# Patient Record
Sex: Female | Born: 1995 | Hispanic: Yes | Marital: Single | Smoking: Never smoker
Health system: Southern US, Community
[De-identification: ages and names within clinical notes are randomized; demographics above are authoritative.]

---

## 2016-02-19 ENCOUNTER — Encounter (HOSPITAL_COMMUNITY): Payer: Self-pay | Admitting: Emergency Medicine

## 2016-02-19 ENCOUNTER — Emergency Department (HOSPITAL_COMMUNITY)
Admission: EM | Admit: 2016-02-19 | Discharge: 2016-02-19 | Disposition: A | Discharge status: Home / Self Care | Payer: Self-pay | Attending: Emergency Medicine | Admitting: Emergency Medicine

## 2016-02-19 DIAGNOSIS — S50312A Abrasion of left elbow, initial encounter: Secondary | ICD-10-CM | POA: Insufficient documentation

## 2016-02-19 DIAGNOSIS — Y92410 Unspecified street and highway as the place of occurrence of the external cause: Secondary | ICD-10-CM | POA: Insufficient documentation

## 2016-02-19 DIAGNOSIS — M79652 Pain in left thigh: Secondary | ICD-10-CM | POA: Insufficient documentation

## 2016-02-19 DIAGNOSIS — Y9389 Activity, other specified: Secondary | ICD-10-CM | POA: Insufficient documentation

## 2016-02-19 DIAGNOSIS — Y998 Other external cause status: Secondary | ICD-10-CM | POA: Insufficient documentation

## 2016-02-19 NOTE — ED Notes (Addendum)
Pt with swelling noted to LUE, moves extremity well. Pt with pain noted to palpation of  L upper leg.

## 2016-02-19 NOTE — ED Notes (Signed)
Pt L nare started bleeding. Pt given tissue and instructed to pinch top of nose to control bleeding. Bleeding has stopped at this time. MD notified.
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
## 2016-02-23 ENCOUNTER — Ambulatory Visit (INDEPENDENT_AMBULATORY_CARE_PROVIDER_SITE_OTHER): Payer: Self-pay | Admitting: Family Medicine

## 2016-02-23 VITALS — BP 108/68 | HR 77 | Temp 98.8°F | Resp 17 | Ht 61.0 in | Wt 118.0 lb

## 2016-02-23 DIAGNOSIS — S39012A Strain of muscle, fascia and tendon of lower back, initial encounter: Secondary | ICD-10-CM

## 2016-02-23 MED ORDER — CYCLOBENZAPRINE HCL 5 MG PO TABS: ORAL_TABLET | ORAL | Status: DC

## 2016-02-23 NOTE — Patient Instructions (Addendum)
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 intended to replace advice given to you by your health care provider. Make sure you discuss any questions you have with your health care provider.   Document Released: 10/17/2005 Document Revised: 11/07/2014 Document Reviewed: 01/29/2009 [employer] Interactive Patient Education [employer]

## 2016-02-23 NOTE — Progress Notes (Signed)
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 relief. Keep in mind, being in any one position for a prolonged period of time,  no matter how correct your posture, can still lead to stiffness. PROPER SITTING POSTURE In order to minimize stress and discomfort on your spine, you must sit with correct posture. Sitting with good posture should be effortless for a healthy body. Returning to good posture is a gradual process. Many people can work toward this most comfortably by using various supports until they have the flexibility and strength to maintain this posture on their own. When sitting with proper posture, your ears will fall over your shoulders and your shoulders will fall over your hips. You should use the back of the chair to support your upper back. Your lower back will be in a neutral position, just slightly arched. You may place a small pillow or folded towel at the base of your lower back for support.  When working  at a desk, create an environment that supports good, upright posture. Without extra support, muscles tire, which leads to excessive strain on joints and other tissues. Keep these recommendations in mind: CHAIR:  A chair should be able to slide under your desk when your back makes contact with the back of the chair. This allows you to work closely.  The chair's height should allow your eyes to be level with the upper part of your monitor and your hands to be slightly lower than your elbows. BODY POSITION  Your feet should make contact with the floor. If this is not possible, use a foot rest.  Keep your ears over your shoulders. This will reduce stress on your neck and lower back. INCORRECT SITTING POSTURES  If you are feeling tired and unable to assume a healthy sitting posture, do not slouch or slump. This puts excessive strain on your back tissues, causing more damage and pain. Healthier options include:  Using more support, like a lumbar pillow.  Switching tasks to something that requires you to be upright or walking.  Talking a brief walk.  Lying down to rest in a neutral-spine position. PROLONGED STANDING WHILE SLIGHTLY LEANING FORWARD  When completing a task that requires you to lean forward while standing in one place for a long time, place either foot up on a stationary 2-4 inch high object to help maintain the best posture. When both feet are on the ground, the lower back tends to lose its slight inward curve. If this curve flattens (or becomes too large), then the back and your other joints will experience too much stress, tire more quickly, and can cause pain. CORRECT STANDING POSTURES Proper standing posture should be assumed with all daily activities, even if they only take a few moments, like when brushing your teeth. As in sitting, your ears should fall over your shoulders and your shoulders should fall over your hips. You should keep a slight tension in your abdominal muscles  to brace your spine. Your tailbone should point down to the ground, not behind your body, resulting in an over-extended swayback posture.  INCORRECT STANDING POSTURES  Common incorrect standing postures include a forward head, locked knees and/or an excessive swayback. WALKING Walk with an upright posture. Your ears, shoulders and hips should all line-up. PROLONGED ACTIVITY IN A FLEXED POSITION When completing a task that requires you to bend forward at your waist or lean over a low surface, try to find a way to stabilize 3 out of 4 of your limbs. You can place a hand or elbow on your thigh or rest a knee on the surface you are reaching across. This will provide  you more stability so that your muscles do not fatigue as quickly. By keeping your knees relaxed, or slightly bent, you will also reduce stress across your lower back. CORRECT LIFTING TECHNIQUES DO :   Assume a wide stance. This will provide you more stability and the opportunity to get as close as possible to the object which you are lifting.  Tense your abdominals to brace your spine. Bend at the knees and hips. Keeping your back locked in a neutral-spine position, lift using your leg muscles. Lift with your legs, keeping your back straight.  Test the weight of unknown objects before attempting to lift them.  Try to keep your elbows locked down at your sides in order get the best strength from your shoulders when carrying an object.  Always ask for help when lifting heavy or awkward objects. INCORRECT LIFTING TECHNIQUES DO NOT:   Lock your knees when lifting, even if it is a small object.  Bend and twist. Pivot at your feet or move your feet when needing to change directions.  Assume that you can safely pick up even a paper clip without proper posture.   This information is not intended to replace advice given to you by your health care provider. Make sure you discuss any questions you have with your health care provider.     Document Released: 10/17/2005 Document Revised: 11/07/2014 Document Reviewed: 01/29/2009 Elsevier Interactive Patient Education [employer]2016 Elsevier Inc.      I personally performed the services described in this documentation, which was scribed in my presence. The recorded information has been reviewed and considered, and addended by me as needed.

## 2016-03-19 ENCOUNTER — Emergency Department (HOSPITAL_COMMUNITY)
Admission: EM | Admit: 2016-03-19 | Discharge: 2016-03-19 | Disposition: A | Discharge status: Home / Self Care | Payer: No Typology Code available for payment source | Attending: Emergency Medicine | Admitting: Emergency Medicine

## 2016-03-19 ENCOUNTER — Encounter (HOSPITAL_COMMUNITY): Payer: Self-pay

## 2016-03-19 DIAGNOSIS — L509 Urticaria, unspecified: Secondary | ICD-10-CM

## 2016-03-19 DIAGNOSIS — R0602 Shortness of breath: Secondary | ICD-10-CM | POA: Insufficient documentation

## 2016-03-19 DIAGNOSIS — J029 Acute pharyngitis, unspecified: Secondary | ICD-10-CM | POA: Insufficient documentation

## 2016-03-19 DIAGNOSIS — L500 Allergic urticaria: Secondary | ICD-10-CM | POA: Insufficient documentation

## 2016-03-19 LAB — RAPID STREP SCREEN (MED CTR MEBANE ONLY): Streptococcus, Group A Screen (Direct): NEGATIVE

## 2016-03-19 MED ORDER — DIPHENHYDRAMINE HCL 25 MG PO TABS: 50.0000 mg | ORAL_TABLET | Freq: Three times a day (TID) | ORAL | Status: DC | PRN

## 2016-03-19 MED ORDER — PREDNISONE 10 MG (21) PO TBPK: 10.0000 mg | ORAL_TABLET | Freq: Every day | ORAL | Status: DC

## 2016-03-19 MED ORDER — SODIUM CHLORIDE 0.9 % IV BOLUS (SEPSIS)
1000.0000 mL | Freq: Once | INTRAVENOUS | Status: AC
  Administered 2016-03-19: 1000 mL via INTRAVENOUS

## 2016-03-19 MED ORDER — METHYLPREDNISOLONE SODIUM SUCC 125 MG IJ SOLR
125.0000 mg | Freq: Once | INTRAMUSCULAR | Status: AC
  Administered 2016-03-19: 125 mg via INTRAVENOUS
  Filled 2016-03-19: qty 2

## 2016-03-19 MED ORDER — FAMOTIDINE IN NACL 20-0.9 MG/50ML-% IV SOLN
20.0000 mg | Freq: Once | INTRAVENOUS | Status: AC
  Administered 2016-03-19: 20 mg via INTRAVENOUS
  Filled 2016-03-19: qty 50

## 2016-03-19 MED ORDER — DIPHENHYDRAMINE HCL 50 MG/ML IJ SOLN
50.0000 mg | Freq: Once | INTRAMUSCULAR | Status: AC
Start: 2016-03-19 — End: 2016-03-19
  Administered 2016-03-19: 50 mg via INTRAVENOUS
  Filled 2016-03-19: qty 1

## 2016-03-19 NOTE — ED Notes (Signed)
Patient left at this time with all belongings. 

## 2016-03-19 NOTE — ED Notes (Addendum)
Pt here tonight due to possible allergic reaction to unknown source. She denies any allergies. She states she noticed the rash Thursday night but it became worse this evening because she felt SOB and her throat hurts when she swallows, which that started at 3pm earlier in the afternoon. Airway clear, red raised splotchy rash noted to be scattered throughout her body. Pt speaking clear, complete sentences.

## 2016-03-19 NOTE — Discharge Instructions (Signed)
Ronchas  (Hives)  Las ronchas son reas de la piel inflamadas (hinchadas) rojas y que pican. Pueden cambiar de tamao y de ubicacin en el cuerpo. Las Armed forces operational officerronchas pueden aparecer y Geneticist, moleculardesaparecer durante algunas horas o das (ronchas agudas) o durante algunas semanas (ronchas crnicas). No pueden transmitirse de [place] persona a Theodoro Clockotra (no son contagiosas). Pueden empeorar al rascarse, hacer ejercicios y por estrs emocional.  CAUSAS   Reaccin alrgica a alimentos, aditivos o frmacos.  Infecciones, incluso el resfro comn.  Enfermedades, como la vasculitis, el lupus o la enfermedad tiroidea.  Exposicin al sol, al calor o al fro.  La prctica de ejercicios.  El estrs.  El contacto con algunas sustancias qumicas. SNTOMAS   Zonas hinchadas, rojas o blancas, sobre la piel. Las ronchas pueden cambiar de [place], forma, Chinaubicacin y Armed forces logistics/support/administrative officerpueden desaparecer repentinamente.  Picazn.  Hinchazn de las The Northwestern Mutualmanos los pies y [place] rostro. Esto puede ocurrir si las ronchas se desarrollan en capas profundas de la piel. DIAGNSTICO  El mdico puede diagnosticar el problema haciendo un examen fsico. Conley RollsLe indicar anlisis de sangre o un estudio de la piel para Production assistant, radiodeterminar la causa. En algunos casos, no puede determinarse la causa.  TRATAMIENTO  Los casos leves generalmente mejoran con medicamentos como los antihistamnicos. Los casos ms graves pueden requerir una inyeccin de epinefrina de Associate Professoremergencia. Si se conoce la causa de la urticaria, el tratamiento incluye evitar el factor desencadenante.  INSTRUCCIONES PARA EL CUIDADO EN EL HOGAR   Evite las causas que han desencadenado las ronchas.  Tome los antihistamnicos segn las indicaciones del mdico para reducir la gravedad de las ronchas. Generalmente se recomiendan los Pathmark Storesantihistamnicos que no son sedantes o con bajo efecto sedante. No conduzca vehculos mientras toma antihistamnicos.  Tome los medicamentos para la picazn exactamente como le indic el  mdico.  Use ropas sueltas.  Cumpla con todas las visitas de control, segn le indique su mdico. SOLICITE ATENCIN MDICA SI:   Siente una picazn intensa o persistente que no se calma con los medicamentos.  Le duelen las articulaciones o estn inflamadas. SOLICITE ATENCIN MDICA DE INMEDIATO SI:   Tiene fiebre.  Tiene la boca o los labios hinchados.  Tiene problemas para respirar o tragar.  Siente una opresin en la garganta o en el pecho.  Siente dolor abdominal. Estos problemas pueden ser los primeros signos de una reaccin alrgica que ponga en peligro la vida. Llame a los servicios de emergencia locales (911 en los [place]). ASEGRESE DE QUE:   Comprende estas instrucciones.  Controlar su enfermedad.  Solicitar ayuda de inmediato si no mejora o si empeora.   Esta informacin no tiene Theme park managercomo fin reemplazar el consejo del mdico. Asegrese de hacerle al mdico cualquier pregunta que tenga.   Document Released: 10/17/2005 Document Revised: 10/22/2013 Elsevier Interactive Patient Education [employer]2016 Elsevier Inc.    To find a primary care or specialty doctor please call [phone] or [phone] to access "[facility] Find a Doctor Service."  You may also go on the [facility] website at [email or web address]  There are also multiple [facility] practices throughout the [place] that are frequently accepting new patients. You may find a clinic that is close to your home and contact them.  [facility] -  [address] [place] [phone]  [facility] in [place] (also locations in [place] and [place]) -  [address] [place] [id number] [phone]  [facility] -  [address] [place] [address] [phone]

## 2016-03-19 NOTE — ED Provider Notes (Signed)
By signing my name below, I, Ronney LionSuzanne Le, attest that this documentation has been prepared under the direction and in the presence of Kristen N Ward, DO. Electronically Signed: Ronney LionSuzanne Le, ED Scribe. 03/19/2016. 3:24 AM.  TIME SEEN: 3:19 AM   CHIEF COMPLAINT: Allergic reaction  HPI:  HPI Comments: Tina ClearMaria Rodas [name] is a 20 y.o. female with no pertinent PMHx, who presents to the Emergency Department complaining of a gradual-onset, constant, gradually worsening, generalized prurituc ticularly on her arms, legs, and back, that began yesterday. She notes associated difficulty breathing and a scratchy sore throat. She denies any new detergents, soaps, lotions, foods, medications. This is a new problem; patient denies any known allergies. She denies any insect or tick bites. Patient reports she had applied an OTC ointment from a [place] store to the affected areas after the rash had appeared, with no relief. She denies wheezes or fever. No lip or tongue swelling.   ROS: See HPI Constitutional: no fever  Eyes: no drainage  ENT: no runny nose   Cardiovascular:  no chest pain  Resp: SOB  GI: no vomiting GU: no dysuria Integumentary: rash  Allergy: hives  Musculoskeletal: no leg swelling  Neurological: no slurred speech ROS otherwise negative  PAST MEDICAL HISTORY/PAST SURGICAL HISTORY:  History reviewed. No pertinent past medical history.  MEDICATIONS:  Prior to Admission medications   Medication Sig Start Date End Date Taking? Authorizing Provider  cyclobenzaprine (FLEXERIL) 5 MG tablet 1 pill by mouth up to every 8 hours as needed. Start with one pill by mouth each bedtime as needed due to sedation 02/23/16   Shade FloodJeffrey R Greene, MD    ALLERGIES:  No Known Allergies  SOCIAL HISTORY:  Social History  Substance Use Topics  . Smoking status: Never Smoker   . Smokeless tobacco: Not on file  . Alcohol Use: No    FAMILY HISTORY: No family history on file.  EXAM: BP 108/74 mmHg   Pulse 83  Temp(Src) 97.7 F (36.5 C) (Oral)  Resp 20  Ht 5\' 2"  (1.575 m)  Wt 117 lb 8 oz (53.298 kg)  BMI 21.49 kg/m2  SpO2 98%  LMP 02/15/2016 CONSTITUTIONAL: Alert and oriented and responds appropriately to questions. Well-appearing; well-nourished, afebrile, nontoxic, no distress, smiling and laughing HEAD: Normocephalic EYES: Conjunctivae [name], PERRL ENT: normal nose; no rhinorrhea; moist mucous membranes; no swelling of the lips or tongue; No pharyngeal erythema or petechiae, no tonsillar hypertrophy or exudate, no uvular deviation, no trismus or drooling, normal phonation, no stridor, no dental caries or abscess noted, no Ludwig's angina, tongue sits flat in the bottom of the mouth NECK: Supple, no meningismus, no LAD  CARD: RRR; S1 and S2 appreciated; no murmurs, no clicks, no rubs, no gallops RESP: Normal chest excursion without splinting or tachypnea; breath sounds [name] and equal bilaterally; no wheezes, no rhonchi, no rales, no hypoxia or respiratory distress, speaking full sentences ABD/GI: Normal bowel sounds; non-distended; soft, non-tender, no rebound, no guarding, no peritoneal signs BACK:  The back appears normal and is non-tender to palpation, there is no CVA tenderness EXT: Normal ROM in all joints; non-tender to palpation; no edema; normal capillary refill; no cyanosis, no calf tenderness or swelling    SKIN: Normal color for age and race; warm; diffuse scattered urticaria; no rash on the palm, soles, or mucous membranes NEURO: Moves all extremities equally, sensation to light touch intact diffusely, cranial nerves II through XII intact PSYCH: The patient's mood and manner are appropriate. Grooming and personal hygiene  are appropriate.  MEDICAL DECISION MAKING: Patient here with allergic reaction. In no significant distress. Given she feels like her throat is scratchy and she is having difficulty breathing as well as hives we Will administer IV Pepcid, Benadryl and  Solu-medrol.  At this time I do not feel she needs epinephrine. We will closely monitor patient.  ED PROGRESS: 5:20 AM  Pt reports feeling much better. Hives are almost essentially gone. Have advised him to continue Benadryl for itching and rash and will discharge on steroid taper. We'll give outpatient PCP follow-up. Discussed return precautions.   At this time, I do not feel there is any life-threatening condition present. I have reviewed and discussed all results (EKG, imaging, lab, urine as appropriate), exam findings with patient. I have reviewed nursing notes and appropriate previous records.  I feel the patient is safe to be discharged home without further emergent workup. Discussed usual and customary return precautions. Patient and family (if present) verbalize understanding and are comfortable with this plan.  Patient will follow-up with their primary care provider. If they do not have a primary care provider, information for follow-up has been provided to them. All questions have been answered.     I personally performed the services described in this documentation, which was scribed in my presence. The recorded information has been reviewed and is accurate.   Layla Maw Ward, DO 03/19/16 [phone]

## 2016-03-21 LAB — CULTURE, GROUP A STREP (THRC)

## 2021-06-27 ENCOUNTER — Other Ambulatory Visit: Payer: Self-pay

## 2021-06-27 ENCOUNTER — Encounter (HOSPITAL_COMMUNITY): Payer: Self-pay

## 2021-06-27 ENCOUNTER — Emergency Department (HOSPITAL_COMMUNITY)
Admission: EM | Admit: 2021-06-27 | Discharge: 2021-06-27 | Disposition: A | Discharge status: Home / Self Care | Payer: Self-pay | Attending: Emergency Medicine | Admitting: Emergency Medicine

## 2021-06-27 DIAGNOSIS — Y906 Blood alcohol level of 120-199 mg/100 ml: Secondary | ICD-10-CM | POA: Insufficient documentation

## 2021-06-27 DIAGNOSIS — Z79899 Other long term (current) drug therapy: Secondary | ICD-10-CM | POA: Insufficient documentation

## 2021-06-27 DIAGNOSIS — E876 Hypokalemia: Secondary | ICD-10-CM | POA: Insufficient documentation

## 2021-06-27 DIAGNOSIS — K2920 Alcoholic gastritis without bleeding: Secondary | ICD-10-CM | POA: Insufficient documentation

## 2021-06-27 LAB — RAPID URINE DRUG SCREEN, HOSP PERFORMED
Amphetamines: NOT DETECTED
Barbiturates: NOT DETECTED
Benzodiazepines: NOT DETECTED
Cocaine: NOT DETECTED
Opiates: POSITIVE — AB
Tetrahydrocannabinol: NOT DETECTED

## 2021-06-27 LAB — COMPREHENSIVE METABOLIC PANEL
ALT: 19 U/L (ref 0–44)
AST: 24 U/L (ref 15–41)
Albumin: 4.5 g/dL (ref 3.5–5.0)
Alkaline Phosphatase: 58 U/L (ref 38–126)
Anion gap: 13 (ref 5–15)
BUN: 11 mg/dL (ref 6–20)
CO2: 19 mmol/L — ABNORMAL LOW (ref 22–32)
Calcium: 9.3 mg/dL (ref 8.9–10.3)
Chloride: 107 mmol/L (ref 98–111)
Creatinine, Ser: 0.72 mg/dL (ref 0.44–1.00)
GFR, Estimated: >60 mL/min (ref >60)
Glucose, Bld: 133 mg/dL — ABNORMAL HIGH (ref 70–99)
Potassium: 3.1 mmol/L — ABNORMAL LOW (ref 3.5–5.1)
Sodium: 139 mmol/L (ref 135–145)
Total Bilirubin: 0.6 mg/dL (ref 0.3–1.2)
Total Protein: 7.5 g/dL (ref 6.5–8.1)

## 2021-06-27 LAB — URINALYSIS, ROUTINE W REFLEX MICROSCOPIC
Bilirubin Urine: NEGATIVE
Glucose, UA: NEGATIVE mg/dL
Ketones, ur: 5 mg/dL — AB
Leukocytes,Ua: NEGATIVE
Nitrite: NEGATIVE
Protein, ur: NEGATIVE mg/dL
Specific Gravity, Urine: 1.013 (ref 1.005–1.030)
pH: 6 (ref 5.0–8.0)

## 2021-06-27 LAB — CBC WITH DIFFERENTIAL/PLATELET
Abs Immature Granulocytes: 0.02 10*3/uL (ref 0.00–0.07)
Basophils Absolute: 0 10*3/uL (ref 0.0–0.1)
Basophils Relative: 1 %
Eosinophils Absolute: 0.1 10*3/uL (ref 0.0–0.5)
Eosinophils Relative: 1 %
HCT: 39.8 % (ref 36.0–46.0)
Hemoglobin: 13.5 g/dL (ref 12.0–15.0)
Immature Granulocytes: 0 %
Lymphocytes Relative: 42 %
Lymphs Abs: 3.4 10*3/uL (ref 0.7–4.0)
MCH: 27.4 pg (ref 26.0–34.0)
MCHC: 33.9 g/dL (ref 30.0–36.0)
MCV: 80.7 fL (ref 80.0–100.0)
Monocytes Absolute: 0.4 10*3/uL (ref 0.1–1.0)
Monocytes Relative: 5 %
Neutro Abs: 4.2 10*3/uL (ref 1.7–7.7)
Neutrophils Relative %: 51 %
Platelets: 288 10*3/uL (ref 150–400)
RBC: 4.93 MIL/uL (ref 3.87–5.11)
RDW: 13.4 % (ref 11.5–15.5)
WBC: 8 10*3/uL (ref 4.0–10.5)
nRBC: 0 % (ref 0.0–0.2)

## 2021-06-27 LAB — LIPASE, BLOOD: Lipase: 26 U/L (ref 11–51)

## 2021-06-27 LAB — I-STAT BETA HCG BLOOD, ED (MC, WL, AP ONLY): I-stat hCG, quantitative: <5 m[IU]/mL (ref <5)

## 2021-06-27 LAB — ETHANOL: Alcohol, Ethyl (B): 145 mg/dL — ABNORMAL HIGH (ref <10)

## 2021-06-27 MED ORDER — ONDANSETRON HCL 4 MG PO TABS: 4.0000 mg | ORAL_TABLET | Freq: Four times a day (QID) | ORAL | 0 refills | Status: DC | PRN

## 2021-06-27 MED ORDER — POTASSIUM CHLORIDE CRYS ER 20 MEQ PO TBCR
40.0000 meq | EXTENDED_RELEASE_TABLET | Freq: Once | ORAL | Status: AC
  Administered 2021-06-27: 40 meq via ORAL
  Filled 2021-06-27: qty 2

## 2021-06-27 MED ORDER — ONDANSETRON HCL 4 MG/2ML IJ SOLN
4.0000 mg | Freq: Once | INTRAMUSCULAR | Status: AC
  Administered 2021-06-27: 4 mg via INTRAVENOUS
  Filled 2021-06-27: qty 2

## 2021-06-27 MED ORDER — MORPHINE SULFATE (PF) 4 MG/ML IV SOLN
4.0000 mg | Freq: Once | INTRAVENOUS | Status: AC
  Administered 2021-06-27: 4 mg via INTRAVENOUS
  Filled 2021-06-27: qty 1

## 2021-06-27 MED ORDER — PANTOPRAZOLE SODIUM 40 MG PO TBEC
40.0000 mg | DELAYED_RELEASE_TABLET | Freq: Once | ORAL | Status: AC
  Administered 2021-06-27: 40 mg via ORAL
  Filled 2021-06-27: qty 1

## 2021-06-27 MED ORDER — LACTATED RINGERS IV BOLUS
1000.0000 mL | Freq: Once | INTRAVENOUS | Status: AC
  Administered 2021-06-27: 1000 mL via INTRAVENOUS

## 2021-06-27 MED ORDER — POTASSIUM CHLORIDE CRYS ER 20 MEQ PO TBCR: 20.0000 meq | EXTENDED_RELEASE_TABLET | Freq: Two times a day (BID) | ORAL | 0 refills | Status: DC

## 2021-06-27 MED ORDER — PANTOPRAZOLE SODIUM 40 MG PO TBEC: 40.0000 mg | DELAYED_RELEASE_TABLET | Freq: Every day | ORAL | 0 refills | Status: DC

## 2021-06-27 NOTE — ED Provider Notes (Signed)
[facility] EMERGENCY DEPARTMENT Provider Note   CSN: [id number] Arrival date & time: 06/27/21  0413     History Chief Complaint  Patient presents with   Abdominal Pain    Tina [name] is a 25 y.o. female.  The history is provided by the patient and the spouse. A language interpreter was used.  Abdominal Pain She had onset tonight of severe epigastric pain with nausea and vomiting.  She reportedly had 1 beer at the party and denies other alcohol consumption.  Pain does not radiate.  She had a syncopal episode while being transported from the car to the emergency department entrance.  Pain does not radiate.  Nothing makes it better, nothing makes it worse.  She denies possibility of pregnancy, just ended her menses.  She denies drug use.   No past medical history on file.  There are no problems to display for this patient.   No past surgical history on file.   OB History   No obstetric history on file.     No family history on file.  Social History   Tobacco Use   Smoking status: Never  Substance Use Topics   Alcohol use: No   Drug use: No    Home Medications Prior to Admission medications   Medication Sig Start Date End Date Taking? Authorizing Provider  diphenhydrAMINE (BENADRYL) 25 MG tablet Take 2 tablets (50 mg total) by mouth every 8 (eight) hours as needed. 03/19/16   Ward, Layla Maw, DO  predniSONE (STERAPRED UNI-PAK 21 TAB) 10 MG (21) TBPK tablet Take 1 tablet (10 mg total) by mouth daily. Take as directed 03/19/16   Ward, Layla Maw, DO    Allergies    Patient has no known allergies.  Review of Systems   Review of Systems  Gastrointestinal:  Positive for abdominal pain.  All other systems reviewed and are negative.  Physical Exam Updated Vital Signs BP 101/66   Pulse 72   Temp 97.7 F (36.5 C) (Oral)   Resp 15   Ht 5\' 2"  (1.575 m)   Wt 53.3 kg   LMP 06/25/2021 (Exact Date)   SpO2 100%   BMI 21.49 kg/m   Physical Exam Vitals and  nursing note reviewed.  25 year old female, agitated and thrashing about the bed and having dry heaves, but is in no acute distress. Vital signs are normal. Oxygen saturation is 100%, which is normal. Head is normocephalic and atraumatic. PERRLA, EOMI. Oropharynx is clear. Neck is nontender and supple without adenopathy or JVD. Back is nontender and there is no CVA tenderness. Lungs are clear without rales, wheezes, or rhonchi. Chest is nontender. Heart has regular rate and rhythm without murmur. Abdomen is soft, flat, with mild epigastric tenderness.  There is no rebound or guarding.  There are no masses or hepatosplenomegaly and peristalsis is hypoactive. Extremities have no cyanosis or edema, full range of motion is present. Skin is warm and dry without rash. Neurologic: Mental status is normal, cranial nerves are intact, there are no motor or sensory deficits.  ED Results / Procedures / Treatments   Labs (all labs ordered are listed, but only abnormal results are displayed) Labs Reviewed  COMPREHENSIVE METABOLIC PANEL - Abnormal; Notable for the following components:      Result Value   Potassium 3.1 (*)    CO2 19 (*)    Glucose, Bld 133 (*)    All other components within normal limits  ETHANOL - Abnormal; Notable for the  following components:   Alcohol, Ethyl (B) 145 (*)    All other components within normal limits  URINALYSIS, ROUTINE W REFLEX MICROSCOPIC - Abnormal; Notable for the following components:   APPearance HAZY (*)    Hgb urine dipstick MODERATE (*)    Ketones, ur 5 (*)    Bacteria, UA RARE (*)    All other components within normal limits  RAPID URINE DRUG SCREEN, HOSP PERFORMED - Abnormal; Notable for the following components:   Opiates POSITIVE (*)    All other components within normal limits  LIPASE, BLOOD  CBC WITH DIFFERENTIAL/PLATELET  I-STAT BETA HCG BLOOD, ED ([facility], [facility], [facility] ONLY)   Procedures Procedures   Medications Ordered in ED Medications   potassium chloride SA (KLOR-CON) CR tablet 40 mEq (has no administration in time range)  pantoprazole (PROTONIX) EC tablet 40 mg (has no administration in time range)  lactated ringers bolus 1,000 mL (0 mLs Intravenous Stopped 06/27/21 0611)  ondansetron (ZOFRAN) injection 4 mg (4 mg Intravenous Given 06/27/21 0422)  morphine 4 MG/ML injection 4 mg (4 mg Intravenous Given 06/27/21 0422)    ED Course  I have reviewed the triage vital signs and the nursing notes.  Pertinent labs & imaging results that were available during my care of the patient were reviewed by me and considered in my medical decision making (see chart for details).    MDM Rules/Calculators/A&P                         Epigastric pain with vomiting, possible acute alcoholic gastritis, consider peptic ulcer disease, biliary colic, pancreatitis.  Old records reviewed, and she has no relevant past visits.  She will be given IV fluids, morphine, ondansetron and will check screening labs.  She feels much better after above-noted treatment.  Labs show elevated ethanol level.  In someone who is not a regular drinker, this certainly could be the cause of all of her symptoms.  Remainder of labs are reassuring.  Incidental finding of hypokalemia and she is given a dose of oral potassium.  She is discharged with prescriptions for K-Dur, ondansetron, pantoprazole.  Advised to limit ethanol consumption to 12-24 ounces of beer a day.  Return precautions discussed.  Final Clinical Impression(s) / ED Diagnoses Final diagnoses:  Acute alcoholic gastritis without hemorrhage  Hypokalemia    Rx / DC Orders ED Discharge Orders          Ordered    potassium chloride SA (KLOR-CON) 20 MEQ tablet  2 times daily        06/27/21 0641    ondansetron (ZOFRAN) 4 MG tablet  Every 6 hours PRN        06/27/21 0641    pantoprazole (PROTONIX) 40 MG tablet  Daily        06/27/21 0641             Dione Booze, MD 06/27/21 [phone]

## 2021-06-27 NOTE — Discharge Instructions (Addendum)
Tu dolor de Teaching laboratory technician se debi a que bebiste demasiado alcohol. No beba ms de 12 a 24 onzas de cerveza en una noche.  Si el dolor regresa, puede tomar un anticido segn sea necesario.  Regrese si los sntomas empeoran.

## 2021-06-27 NOTE — ED Triage Notes (Signed)
Pt presents to ED with abd pain, pt assisted out of car, pt with spouse- pt dry heaving. Pt  and spouse speak very little english- interpretor being used via stratus Pt denies pregnancy, pain started tonight after attending party. Pt consumed one alcoholic drink at party- pt did not eat at party. Pt says she just finished menstrual cycle.

## 2021-08-02 DIAGNOSIS — O039 Complete or unspecified spontaneous abortion without complication: Secondary | ICD-10-CM

## 2021-08-02 DIAGNOSIS — IMO0001 Reserved for inherently not codable concepts without codable children: Secondary | ICD-10-CM

## 2021-08-02 HISTORY — DX: Reserved for inherently not codable concepts without codable children: IMO0001

## 2021-08-02 HISTORY — DX: Complete or unspecified spontaneous abortion without complication: O03.9

## 2021-08-06 ENCOUNTER — Emergency Department (HOSPITAL_COMMUNITY): Payer: Self-pay

## 2021-08-06 ENCOUNTER — Other Ambulatory Visit (HOSPITAL_COMMUNITY): Payer: Self-pay

## 2021-08-06 ENCOUNTER — Other Ambulatory Visit: Payer: Self-pay

## 2021-08-06 ENCOUNTER — Emergency Department (HOSPITAL_COMMUNITY)
Admission: EM | Admit: 2021-08-06 | Discharge: 2021-08-06 | Disposition: A | Discharge status: Home / Self Care | Payer: Self-pay | Attending: Emergency Medicine | Admitting: Emergency Medicine

## 2021-08-06 ENCOUNTER — Encounter (HOSPITAL_COMMUNITY): Payer: Self-pay | Admitting: *Deleted

## 2021-08-06 DIAGNOSIS — R112 Nausea with vomiting, unspecified: Secondary | ICD-10-CM | POA: Insufficient documentation

## 2021-08-06 DIAGNOSIS — R1011 Right upper quadrant pain: Secondary | ICD-10-CM

## 2021-08-06 DIAGNOSIS — N719 Inflammatory disease of uterus, unspecified: Secondary | ICD-10-CM | POA: Insufficient documentation

## 2021-08-06 DIAGNOSIS — N9489 Other specified conditions associated with female genital organs and menstrual cycle: Secondary | ICD-10-CM | POA: Insufficient documentation

## 2021-08-06 DIAGNOSIS — R102 Pelvic and perineal pain: Secondary | ICD-10-CM

## 2021-08-06 DIAGNOSIS — D72829 Elevated white blood cell count, unspecified: Secondary | ICD-10-CM | POA: Insufficient documentation

## 2021-08-06 LAB — URINALYSIS, ROUTINE W REFLEX MICROSCOPIC
Bacteria, UA: NONE SEEN
Bilirubin Urine: NEGATIVE
Glucose, UA: NEGATIVE mg/dL
Ketones, ur: 5 mg/dL — AB
Nitrite: NEGATIVE
Protein, ur: 100 mg/dL — AB
RBC / HPF: >50 RBC/hpf — ABNORMAL HIGH (ref 0–5)
Specific Gravity, Urine: 1.026 (ref 1.005–1.030)
WBC, UA: >50 WBC/hpf — ABNORMAL HIGH (ref 0–5)
pH: 7 (ref 5.0–8.0)

## 2021-08-06 LAB — CBC WITH DIFFERENTIAL/PLATELET
Abs Immature Granulocytes: 0.13 10*3/uL — ABNORMAL HIGH (ref 0.00–0.07)
Basophils Absolute: 0 10*3/uL (ref 0.0–0.1)
Basophils Relative: 0 %
Eosinophils Absolute: 0 10*3/uL (ref 0.0–0.5)
Eosinophils Relative: 0 %
HCT: 39.5 % (ref 36.0–46.0)
Hemoglobin: 13.1 g/dL (ref 12.0–15.0)
Immature Granulocytes: 1 %
Lymphocytes Relative: 3 %
Lymphs Abs: 0.6 10*3/uL — ABNORMAL LOW (ref 0.7–4.0)
MCH: 27.5 pg (ref 26.0–34.0)
MCHC: 33.2 g/dL (ref 30.0–36.0)
MCV: 83 fL (ref 80.0–100.0)
Monocytes Absolute: 0.7 10*3/uL (ref 0.1–1.0)
Monocytes Relative: 4 %
Neutro Abs: 17.6 10*3/uL — ABNORMAL HIGH (ref 1.7–7.7)
Neutrophils Relative %: 92 %
Platelets: 220 10*3/uL (ref 150–400)
RBC: 4.76 MIL/uL (ref 3.87–5.11)
RDW: 13.3 % (ref 11.5–15.5)
WBC: 19.1 10*3/uL — ABNORMAL HIGH (ref 4.0–10.5)
nRBC: 0 % (ref 0.0–0.2)

## 2021-08-06 LAB — COMPREHENSIVE METABOLIC PANEL
ALT: 47 U/L — ABNORMAL HIGH (ref 0–44)
AST: 22 U/L (ref 15–41)
Albumin: 4.3 g/dL (ref 3.5–5.0)
Alkaline Phosphatase: 62 U/L (ref 38–126)
Anion gap: 8 (ref 5–15)
BUN: 9 mg/dL (ref 6–20)
CO2: 23 mmol/L (ref 22–32)
Calcium: 8.7 mg/dL — ABNORMAL LOW (ref 8.9–10.3)
Chloride: 105 mmol/L (ref 98–111)
Creatinine, Ser: 0.69 mg/dL (ref 0.44–1.00)
GFR, Estimated: >60 mL/min (ref >60)
Glucose, Bld: 119 mg/dL — ABNORMAL HIGH (ref 70–99)
Potassium: 3.7 mmol/L (ref 3.5–5.1)
Sodium: 136 mmol/L (ref 135–145)
Total Bilirubin: 0.7 mg/dL (ref 0.3–1.2)
Total Protein: 7.3 g/dL (ref 6.5–8.1)

## 2021-08-06 LAB — I-STAT BETA HCG BLOOD, ED (MC, WL, AP ONLY): I-stat hCG, quantitative: >2000 m[IU]/mL — ABNORMAL HIGH (ref <5)

## 2021-08-06 MED ORDER — ONDANSETRON HCL 4 MG/2ML IJ SOLN
4.0000 mg | Freq: Once | INTRAMUSCULAR | Status: AC
  Administered 2021-08-06: 4 mg via INTRAVENOUS
  Filled 2021-08-06: qty 2

## 2021-08-06 MED ORDER — AMOXICILLIN-POT CLAVULANATE 875-125 MG PO TABS: 1.0000 | ORAL_TABLET | Freq: Two times a day (BID) | ORAL | 0 refills | Status: AC

## 2021-08-06 MED ORDER — SODIUM CHLORIDE 0.9 % IV BOLUS
1000.0000 mL | Freq: Once | INTRAVENOUS | Status: AC
  Administered 2021-08-06: 1000 mL via INTRAVENOUS

## 2021-08-06 NOTE — ED Notes (Addendum)
Interpreter being used for assessment.  Pain started Tues afternoon and she wasn't doing anything prior to pain and it was sudden. Pt has been taking Tylenol and Ibuprofen. Vomited once today, no diarrhea and no changes in bowel movements. Last BM yesterday. Menses since Weds afternoon.

## 2021-08-06 NOTE — Discharge Instructions (Addendum)
Take antibiotics as prescribed. Bleeding will likely continue to decrease over the next few days and you should start to feel better. Take ibuprofen and tylenol OTC as needed for continued pain management. Follow-up with OB/GYN for continued symptom management. Return if symptoms worsen.

## 2021-08-06 NOTE — ED Notes (Signed)
Pt verbalized she had a abortion via pill on Monday.

## 2021-08-06 NOTE — ED Triage Notes (Signed)
Interpreter # [phone], Jose used in triage.   Pt c/o lower abdominal pain and nausea that started Tuesday. Denies v/d. Menstrual cycle started on Wednesday.

## 2021-08-06 NOTE — ED Provider Notes (Signed)
[facility] EMERGENCY DEPARTMENT Provider Note   CSN: [id number] Arrival date & time: 08/06/21  0820     History Chief Complaint  Patient presents with   Abdominal Pain    Tina [name] is a 25 y.o. female.  Patient presents today with complaint of abdominal pain.  States that on Monday she had a pill abortion at [redacted] weeks gestation at [facility] in [place]. She then developed abdominal pain suddenly on Tuesday when she was sitting on her couch.  Pain has been constant and worsening since onset of symptoms.  She then began bleeding on Wednesday afternoon. States that blood is thinner and bright red. She states she is going through 3 tampons a day. Patient is G1P0. Pain is located in bilateral lower quadrants, and radiates to her back.  She has been trying ibuprofen and Tylenol for symptoms without relief.  She endorses some nausea and one episode of nonbloody vomiting in the hospital today.  No fevers, no changes in bowel habits, last bowel movement was yesterday and normal.  No urinary symptoms, dysuria, hematuria, or flank pain.   The history is provided by the patient. A language interpreter was used.  Abdominal Pain Associated symptoms: nausea, vaginal bleeding and vomiting   Associated symptoms: no chest pain, no chills, no constipation, no cough, no diarrhea, no dysuria, no fatigue, no fever, no hematuria, no shortness of breath and no sore throat       History reviewed. No pertinent past medical history.  There are no problems to display for this patient.   History reviewed. No pertinent surgical history.   OB History   No obstetric history on file.     No family history on file.  Social History   Tobacco Use   Smoking status: Never   Smokeless tobacco: Never  Vaping Use   Vaping Use: Never used  Substance Use Topics   Alcohol use: Yes    Comment: rarely   Drug use: No    Home Medications Prior to Admission medications   Medication Sig Start  Date End Date Taking? Authorizing Provider  diphenhydrAMINE (BENADRYL) 25 MG tablet Take 2 tablets (50 mg total) by mouth every 8 (eight) hours as needed. 03/19/16   Ward, Layla Maw, DO  ondansetron (ZOFRAN) 4 MG tablet Take 1 tablet (4 mg total) by mouth every 6 (six) hours as needed for nausea or vomiting. 06/27/21   Dione Booze, MD  pantoprazole (PROTONIX) 40 MG tablet Take 1 tablet (40 mg total) by mouth daily. 06/27/21   Dione Booze, MD  potassium chloride SA (KLOR-CON) 20 MEQ tablet Take 1 tablet (20 mEq total) by mouth 2 (two) times daily. 06/27/21   Dione Booze, MD  predniSONE (STERAPRED UNI-PAK 21 TAB) 10 MG (21) TBPK tablet Take 1 tablet (10 mg total) by mouth daily. Take as directed 03/19/16   Ward, Layla Maw, DO    Allergies    Patient has no known allergies.  Review of Systems   Review of Systems  Constitutional:  Negative for chills, diaphoresis, fatigue and fever.  HENT:  Negative for congestion, postnasal drip, rhinorrhea, sore throat, trouble swallowing and voice change.   Respiratory:  Negative for cough and shortness of breath.   Cardiovascular:  Negative for chest pain, palpitations and leg swelling.  Gastrointestinal:  Positive for abdominal pain, nausea and vomiting. Negative for abdominal distention, anal bleeding, blood in stool, constipation, diarrhea and rectal pain.  Genitourinary:  Positive for pelvic pain and vaginal bleeding.  Negative for difficulty urinating, dysuria, enuresis, flank pain, frequency, hematuria and menstrual problem.  Skin:  Negative for color change and pallor.  Neurological:  Negative for dizziness, tremors, seizures, syncope, facial asymmetry, speech difficulty, weakness, light-headedness, numbness and headaches.  Hematological:  Does not bruise/bleed easily.  Psychiatric/Behavioral:  Negative for confusion and decreased concentration.   All other systems reviewed and are negative.  Physical Exam Updated Vital Signs BP (!) 105/55   Pulse 96    Temp 99.1 F (37.3 C) (Oral)   Resp 16   Wt 60.3 kg   LMP 08/04/2021 (Exact Date)   SpO2 100%   BMI 24.33 kg/m   Physical Exam Vitals and nursing note reviewed. Exam conducted with a chaperone present Bonita Quin, RN present for vaginal exam).  Constitutional:      General: She is not in acute distress.    Appearance: Normal appearance. She is well-developed and normal weight. She is not ill-appearing, toxic-appearing or diaphoretic.  HENT:     Head: Normocephalic and atraumatic.  Cardiovascular:     Rate and Rhythm: Normal rate and regular rhythm.     Heart sounds: Normal heart sounds. No murmur heard. Pulmonary:     Effort: Pulmonary effort is normal. No respiratory distress.     Breath sounds: Normal breath sounds. No stridor. No wheezing, rhonchi or rales.  Abdominal:     General: Abdomen is flat. Bowel sounds are normal. There is no distension.     Palpations: Abdomen is soft.     Tenderness: There is abdominal tenderness in the right lower quadrant, periumbilical area, suprapubic area and left lower quadrant. There is no right CVA tenderness, left CVA tenderness, guarding or rebound. Negative signs include Murphy's sign, Rovsing's sign, McBurney's sign, psoas sign and obturator sign.  Genitourinary:    Vagina: No signs of injury and foreign body. Bleeding present. No vaginal discharge, erythema or tenderness.     Cervix: Normal.     Comments: Some bright red blood present in the vagina without clots, no POC noted, cervix closed. Musculoskeletal:        General: Normal range of motion.     Cervical back: Normal range of motion.  Skin:    General: Skin is warm and dry.  Neurological:     General: No focal deficit present.     Mental Status: She is alert.  Psychiatric:        Mood and Affect: Mood normal.        Behavior: Behavior normal.    ED Results / Procedures / Treatments   Labs (all labs ordered are listed, but only abnormal results are displayed) Labs Reviewed   COMPREHENSIVE METABOLIC PANEL - Abnormal; Notable for the following components:      Result Value   Glucose, Bld 119 (*)    Calcium 8.7 (*)    ALT 47 (*)    All other components within normal limits  CBC WITH DIFFERENTIAL/PLATELET - Abnormal; Notable for the following components:   WBC 19.1 (*)    Neutro Abs 17.6 (*)    Lymphs Abs 0.6 (*)    Abs Immature Granulocytes 0.13 (*)    All other components within normal limits  URINALYSIS, ROUTINE W REFLEX MICROSCOPIC - Abnormal; Notable for the following components:   Color, Urine AMBER (*)    APPearance CLOUDY (*)    Hgb urine dipstick LARGE (*)    Ketones, ur 5 (*)    Protein, ur 100 (*)    Leukocytes,Ua SMALL (*)  RBC / HPF >50 (*)    WBC, UA >50 (*)    All other components within normal limits  I-STAT BETA HCG BLOOD, ED ([facility], [facility], [facility] ONLY) - Abnormal; Notable for the following components:   I-stat hCG, quantitative >2,000.0 (*)    All other components within normal limits    EKG None  Radiology US Abdomen Limited  Result Date: 08/06/2021 CLINICAL DATA:  Chronic epigastric abdominal pain. EXAM: ULTRASOUND ABDOMEN LIMITED RIGHT UPPER QUADRANT COMPARISON:  None. FINDINGS: Gallbladder: No gallstones or wall thickening visualized. No sonographic Murphy sign noted by sonographer. Common bile duct: Diameter: 3 mm which is within normal limits. Liver: No focal lesion identified. Within normal limits in parenchymal echogenicity. Portal vein is patent on color Doppler imaging with normal direction of blood flow towards the liver. Other: None. IMPRESSION: No definite abnormality seen in the right upper quadrant of the abdomen. Electronically Signed   By: Lupita Raider M.D.   On: 08/06/2021 14:17   US OB LESS THAN 14 WEEKS WITH OB TRANSVAGINAL  Result Date: 08/06/2021 CLINICAL DATA:  Pelvic pain, cramping, and vaginal bleeding. Status post chemical abortion several days ago. EXAM: OBSTETRIC <14 WK [place] AND TRANSVAGINAL OB US TECHNIQUE: Both  transabdominal and transvaginal ultrasound examinations were performed for complete evaluation of the gestation as well as the maternal uterus, adnexal regions, and pelvic cul-de-sac. Transvaginal technique was performed to assess early pregnancy. COMPARISON:  None. FINDINGS: Intrauterine gestational sac: None Maternal uterus/adnexae: Endometrium has a somewhat heterogeneous appearance of measures 9 mm in thickness. No mass or fluid seen within the endometrial cavity. No evidence of fibroids. Both ovaries are normal in appearance. No evidence of adnexal mass or free fluid. IMPRESSION: No IUP visualized. Heterogeneous endometrium measuring 9 mm in thickness,, without focal lesion or fluid collection. No evidence of adnexal mass or free fluid. Electronically Signed   By: Danae Orleans M.D.   On: 08/06/2021 14:24    Procedures Procedures   Medications Ordered in ED Medications  ondansetron (ZOFRAN) injection 4 mg (4 mg Intravenous Given 08/06/21 1047)  sodium chloride 0.9 % bolus 1,000 mL (0 mLs Intravenous Stopped 08/06/21 1426)    ED Course  I have reviewed the triage vital signs and the nursing notes.  Pertinent labs & imaging results that were available during my care of the patient were reviewed by me and considered in my medical decision making (see chart for details).    MDM Rules/Calculators/A&P                          Patient presents with pelvic pain and vaginal bleeding after pill abortion Monday. Patient is nontoxic, nonseptic appearing, in no apparent distress.  Patient's pain and other symptoms adequately managed in emergency department.  Fluid bolus given.  Labs, imaging and vitals reviewed. Leukocytosis noted at 19, no anemia noted, low suspicion for hemodynamic instability at this time. hCG noted to be elevated at 2,000, obtained transvaginal US which revealed no IUP visualized. Heterogeneous endometrium measuring 9 mm in thickness, without focal lesion or fluid collection. No  evidence of adnexal mass or free fluid. Discussed finding with OB/GYN Dr. Despina Hidden who feels patient likely has endometritis and recommends Augmentin treatment for same with close OOB/GYN follow-up. Will follow recommendations. Feel this diagnosis likely explains patients symptoms and leukocytosis. Patient does not meet the SIRS or Sepsis criteria.  On repeat exam patient does not have a surgical abdomin and there are no peritoneal  signs. Low suspicion for underlying appendicitis, cholecystitis, or bowl obstruction at this time. Patient discharged home with antibiotics and symptomatic treatment and given strict instructions for follow-up with their primary care physician.  I have also discussed reasons to return immediately to the ER.  Patient expresses understanding and agrees with plan.  This is a shared visit with supervising physician Dr. Charm Barges who has independently evaluated patient & provided guidance in evaluation/management/disposition, in agreement with care     Final Clinical Impression(s) / ED Diagnoses Final diagnoses:  Endometritis    Rx / DC Orders ED Discharge Orders          Ordered    amoxicillin-clavulanate (AUGMENTIN) 875-125 MG tablet  Every 12 hours        08/06/21 1614          An After Visit Summary was printed and given to the patient.    Vear Clock 08/06/21 1837    Terrilee Files, MD 08/07/21 1014

## 2022-05-14 ENCOUNTER — Ambulatory Visit
Admission: EM | Admit: 2022-05-14 | Discharge: 2022-05-14 | Disposition: A | Discharge status: Home / Self Care | Payer: Self-pay

## 2022-05-14 DIAGNOSIS — J039 Acute tonsillitis, unspecified: Secondary | ICD-10-CM | POA: Insufficient documentation

## 2022-05-14 DIAGNOSIS — J029 Acute pharyngitis, unspecified: Secondary | ICD-10-CM | POA: Insufficient documentation

## 2022-05-14 LAB — POCT RAPID STREP A (OFFICE): Rapid Strep A Screen: NEGATIVE

## 2022-05-14 LAB — POCT MONO SCREEN (KUC): Mono, POC: NEGATIVE

## 2022-05-14 MED ORDER — AMOXICILLIN-POT CLAVULANATE 400-57 MG/5ML PO SUSR: 875.0000 mg | Freq: Two times a day (BID) | ORAL | 0 refills | Status: AC

## 2022-05-14 NOTE — Discharge Instructions (Signed)
Your strep and mono were negative.  I am going to start an antibiotic to cover for tonsillitis.  Start Augmentin twice daily.  Gargle with warm salt water and alternate Tylenol and ibuprofen.  If your symptoms do not improving within 1 to 2 days please return for reevaluation.  If anything worsens and you have difficulty swallowing, high fever, muffled voice, nausea/vomiting you need to go to the emergency room.

## 2022-05-14 NOTE — ED Triage Notes (Signed)
Pt reports sore throat and fever x 3 days. Tylenol and ibuprofen gives some relief.

## 2022-05-14 NOTE — ED Provider Notes (Signed)
[facility]    CSN: [id number] Arrival date & time: 05/14/22  1040      History   Chief Complaint Chief Complaint  Patient presents with   Sore Throat    HPI Glorene Leitzke is a 26 y.o. female.   Patient presents today with 3-day history of fever and sore throat.  Patient is Spanish-speaking and video interpreter was utilized during visit.  Reports sore throat pain is rated 10 on a 0-10 pain scale, described as aching, worse with swallowing, no alleviating factors notified.  She denies any cough, congestion, vomiting, diarrhea, chest pain, shortness of breath.  Denies any known sick contacts.  She has tried Tylenol and ibuprofen with minimal improved.  She is having difficulty eating and drinking normally due to severity of pain with swallowing.  She has been able to push fluids despite symptoms.  She is confident that she is not pregnant.  Denies any recent steroid or antibiotic use.    Past Medical History:  Diagnosis Date   Abortion 08/02/2021   via pill at 6 weeks    There are no problems to display for this patient.   History reviewed. No pertinent surgical history.  OB History   No obstetric history on file.      Home Medications    Prior to Admission medications   Medication Sig Start Date End Date Taking? Authorizing Provider  amoxicillin-clavulanate (AUGMENTIN) 400-57 MG/5ML suspension Take 10.9 mLs (875 mg total) by mouth 2 (two) times daily for 10 days. 05/14/22 05/24/22 Yes Joanmarie Tsang K, PA-C  ibuprofen (ADVIL) 200 MG tablet Take 200 mg by mouth every 6 (six) hours as needed.   Yes [provider]  acetaminophen (TYLENOL) 325 MG tablet Take 650 mg by mouth every 6 (six) hours as needed for moderate pain or fever.    [provider]    Family History History reviewed. No pertinent family history.  Social History Social History   Tobacco Use   Smoking status: Never   Smokeless tobacco: Never  Vaping Use   Vaping  Use: Never used  Substance Use Topics   Alcohol use: Yes    Comment: rarely   Drug use: No     Allergies   Patient has no known allergies.   Review of Systems Review of Systems  Constitutional:  Positive for activity change, fatigue and fever. Negative for appetite change.  HENT:  Positive for sore throat and trouble swallowing. Negative for congestion, sinus pressure, sneezing and voice change.   Respiratory:  Negative for cough and shortness of breath.   Cardiovascular:  Negative for chest pain.  Gastrointestinal:  Negative for abdominal pain, diarrhea, nausea and vomiting.  Neurological:  Negative for dizziness, light-headedness and headaches.     Physical Exam Triage Vital Signs ED Triage Vitals  Enc Vitals Group     BP 05/14/22 1131 105/69     Pulse Rate 05/14/22 1131 (!) 103     Resp 05/14/22 1131 16     Temp 05/14/22 1131 99.7 F (37.6 C)     Temp Source 05/14/22 1131 Oral     SpO2 05/14/22 1131 96 %     Weight --      Height --      Head Circumference --      Peak Flow --      Pain Score 05/14/22 1133 10     Pain Loc --      Pain Edu? --  Excl. in GC? --    No data found.  Updated Vital Signs BP 105/69 (BP Location: Right Arm)   Pulse (!) 103   Temp 99.7 F (37.6 C) (Oral)   Resp 16   LMP  (Within Days)   SpO2 96%   Visual Acuity Right Eye Distance:   Left Eye Distance:   Bilateral Distance:    Right Eye Near:   Left Eye Near:    Bilateral Near:     Physical Exam Vitals reviewed.  Constitutional:      General: She is awake. She is not in acute distress.    Appearance: Normal appearance. She is well-developed. She is not ill-appearing.     Comments: Very pleasant female appears stated age in no acute distress sitting comfortably in exam room  HENT:     Head: Normocephalic and atraumatic.     Right Ear: Tympanic membrane, ear canal and external ear normal. Tympanic membrane is not erythematous or bulging.     Left Ear: Tympanic  membrane, ear canal and external ear normal. Tympanic membrane is not erythematous or bulging.     Nose:     Right Sinus: No maxillary sinus tenderness or frontal sinus tenderness.     Left Sinus: No maxillary sinus tenderness or frontal sinus tenderness.     Mouth/Throat:     Pharynx: Uvula midline. Posterior oropharyngeal erythema present. No oropharyngeal exudate.     Tonsils: Tonsillar exudate present. No tonsillar abscesses. 1+ on the right. 1+ on the left.  Cardiovascular:     Rate and Rhythm: Normal rate and regular rhythm.     Heart sounds: Normal heart sounds, S1 normal and S2 normal. No murmur heard. Pulmonary:     Effort: Pulmonary effort is normal.     Breath sounds: Normal breath sounds. No wheezing, rhonchi or rales.     Comments: Clear to auscultation bilaterally Lymphadenopathy:     Head:     Right side of head: No submental, submandibular or tonsillar adenopathy.     Left side of head: No submental, submandibular or tonsillar adenopathy.     Cervical: No cervical adenopathy.  Psychiatric:        Behavior: Behavior is cooperative.      UC Treatments / Results  Labs (all labs ordered are listed, but only abnormal results are displayed) Labs Reviewed  CULTURE, GROUP A STREP [facility])  POCT RAPID STREP A (OFFICE)  POCT MONO SCREEN [facility])    EKG   Radiology No results found.  Procedures Procedures (including critical care time)  Medications Ordered in UC Medications - No data to display  Initial Impression / Assessment and Plan / UC Course  I have reviewed the triage vital signs and the nursing notes.  Pertinent labs & imaging results that were available during my care of the patient were reviewed by me and considered in my medical decision making (see chart for details).     Rapid strep was negative in clinic today.  Mono testing was negative in clinic today.  Physical exam consistent with exudative tonsillitis.  Patient was empirically treated with  Augmentin twice daily for 10 days.  Discussed that if develops any rash she needs to stop the medication to be seen immediately.  She can gargle with warm salt water and alternate Tylenol and ibuprofen for pain.  Discussed that if her symptoms are not improving within 1 to 2 days she needs to be seen immediately.  If she has any worsening symptoms including  swelling of her throat, difficulty speaking, muffled voice, dysphagia, high fever not responding to medication, nausea/vomiting interfering with oral intake, decreased oral intake she needs to go to the emergency room immediately to which she expressed understanding.  Strict return precautions given.  Work excuse note provided.  Final Clinical Impressions(s) / UC Diagnoses   Final diagnoses:  Sore throat  Acute tonsillitis, unspecified etiology     Discharge Instructions      Your strep and mono were negative.  I am going to start an antibiotic to cover for tonsillitis.  Start Augmentin twice daily.  Gargle with warm salt water and alternate Tylenol and ibuprofen.  If your symptoms do not improving within 1 to 2 days please return for reevaluation.  If anything worsens and you have difficulty swallowing, high fever, muffled voice, nausea/vomiting you need to go to the emergency room.     ED Prescriptions     Medication Sig Dispense Auth. Provider   amoxicillin-clavulanate (AUGMENTIN) 400-57 MG/5ML suspension Take 10.9 mLs (875 mg total) by mouth 2 (two) times daily for 10 days. 220 mL Yiselle Babich K, PA-C      PDMP not reviewed this encounter.   Jeani Hawking, PA-C 05/14/22 1213

## 2022-05-17 LAB — CULTURE, GROUP A STREP (THRC)

## 2022-08-02 IMAGING — US US OB < 14 WEEKS - US OB TV
1 series · 14 of 28 positions shown · non-contrast
Comparison: None.

CLINICAL DATA: Pelvic pain, cramping, and vaginal bleeding. Status
post chemical abortion several days ago.

EXAM:
OBSTETRIC <14 WK US AND TRANSVAGINAL OB US
TECHNIQUE: Both transabdominal and transvaginal ultrasound examinations were
performed for complete evaluation of the gestation as well as the
maternal uterus, adnexal regions, and pelvic cul-de-sac.
Transvaginal technique was performed to assess early pregnancy.

[Series 1: early ob us · arterial · 64 acquisitions, 14 frames shown]
[im 3/64]
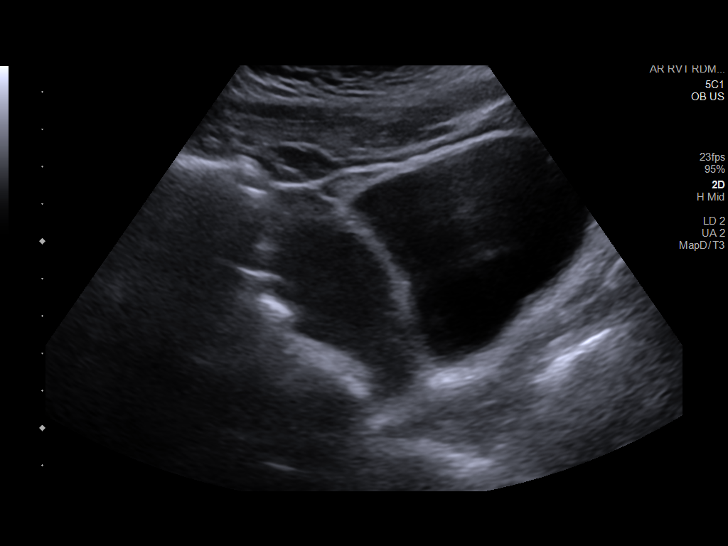
[im 8/64]
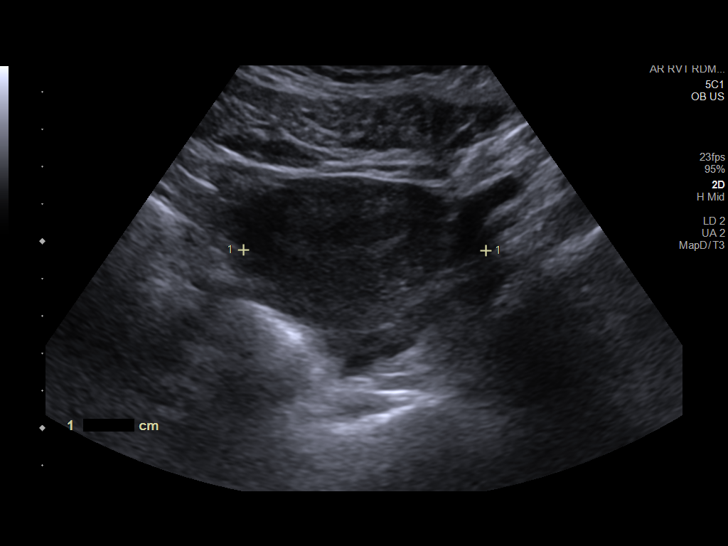
[im 12/64]
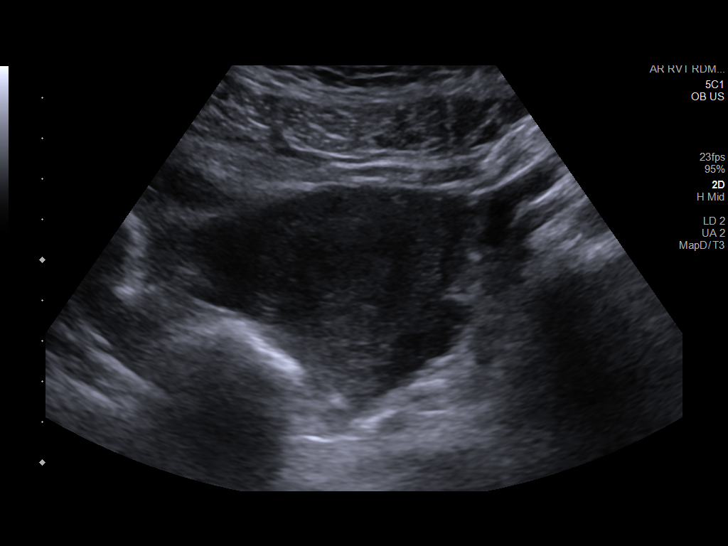
[im 17/64]
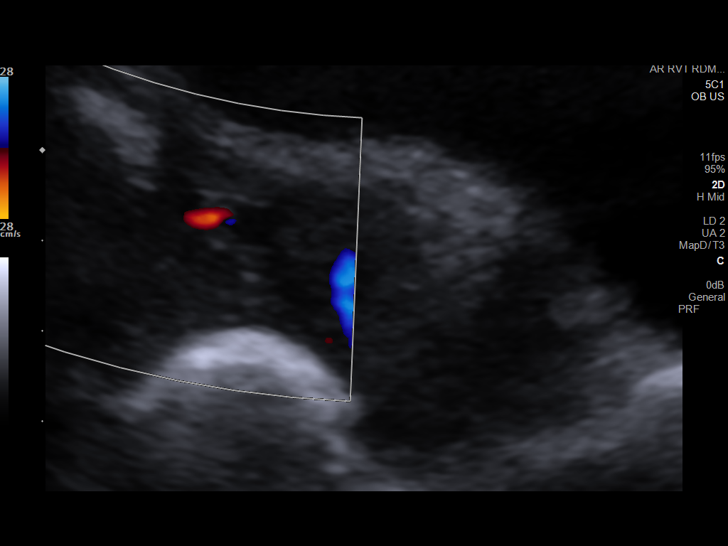
[im 22/64]
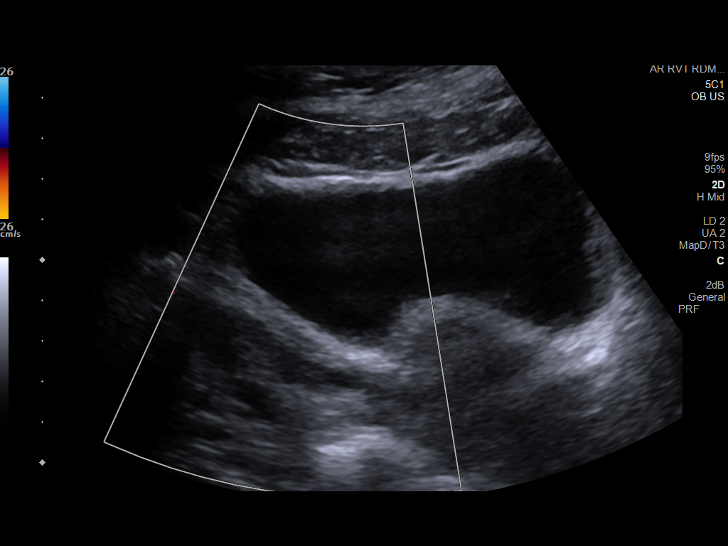
[im 26/64]
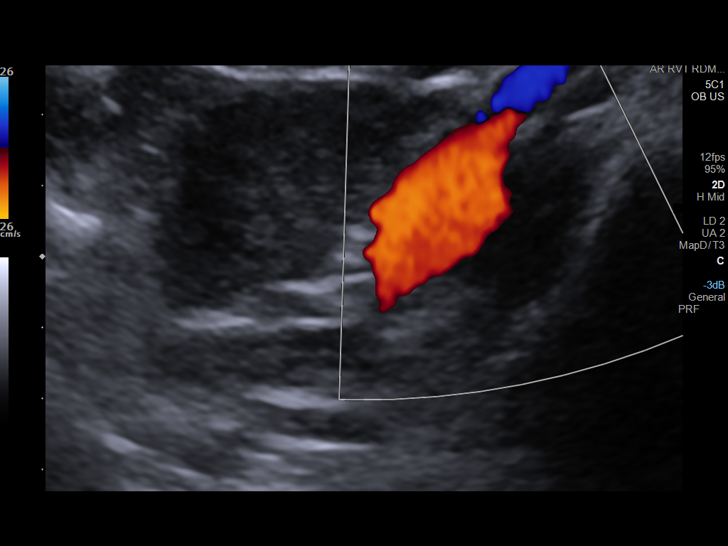
[im 31/64]
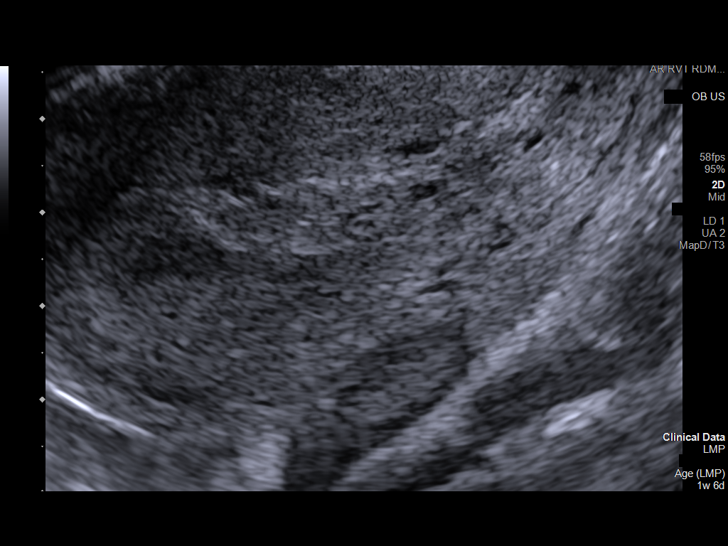
[im 36/64]
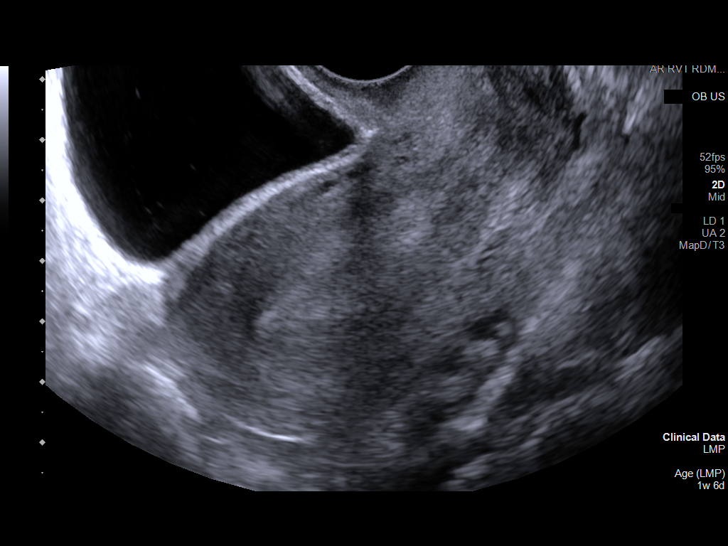
[im 40/64]
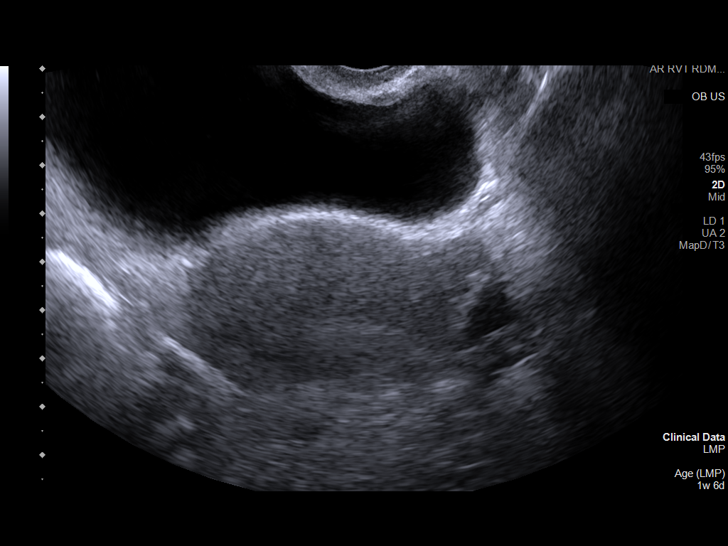
[im 45/64]
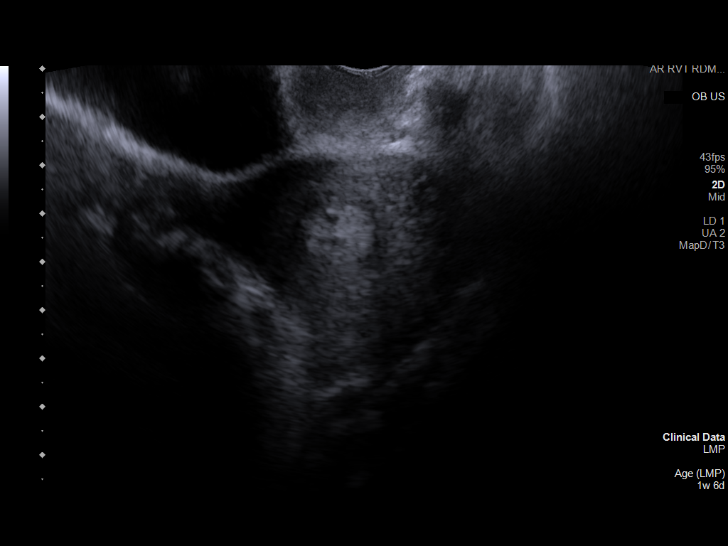
[im 50/64]
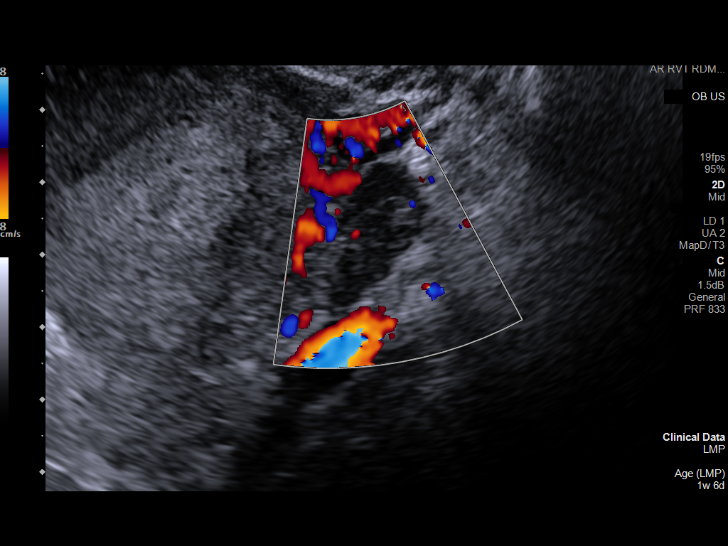
[im 54/64]
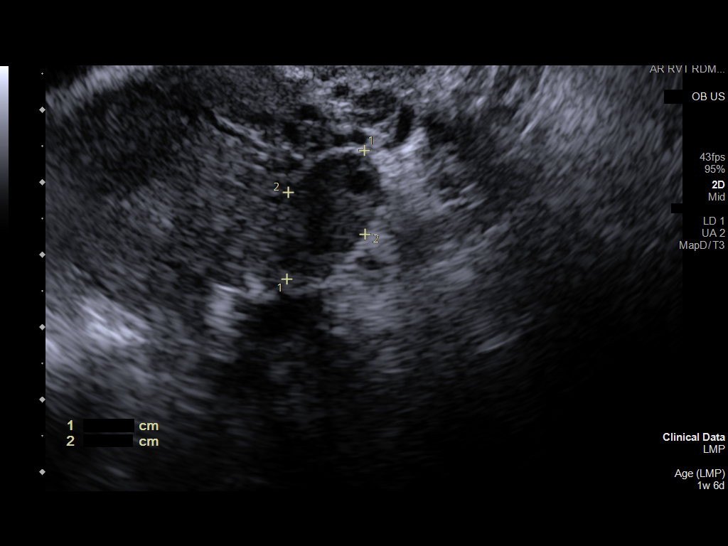
[im 59/64]
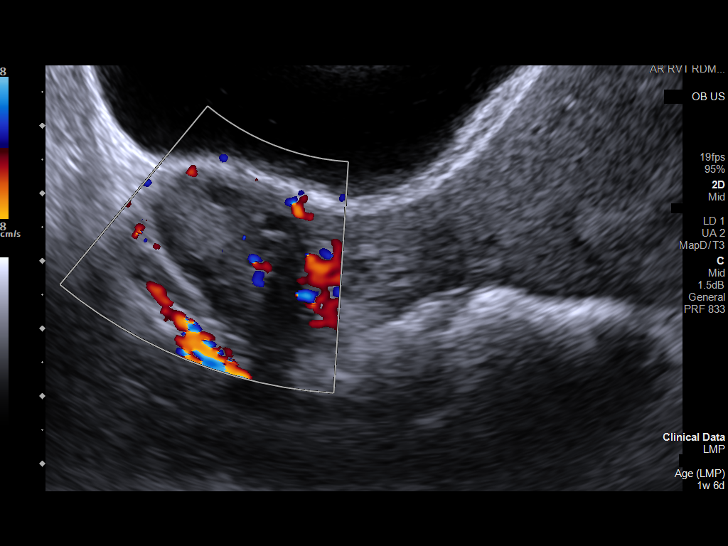
[im 64/64]
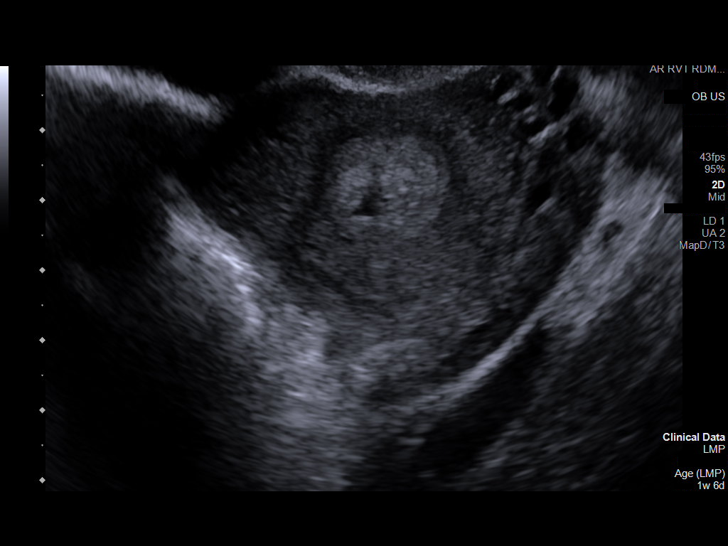

[14 of 28 positions shown; findings below may reference images not displayed]

FINDINGS: Intrauterine gestational sac: None

Maternal uterus/adnexae: Endometrium has a somewhat heterogeneous
appearance of measures 9 mm in thickness. No mass or fluid seen
within the endometrial cavity. No evidence of fibroids.

Both ovaries are normal in appearance. No evidence of adnexal mass
or free fluid.
IMPRESSION: No IUP visualized. Heterogeneous endometrium measuring 9 mm in
thickness,, without focal lesion or fluid collection.

No evidence of adnexal mass or free fluid.

## 2022-08-02 IMAGING — US US ABDOMEN LIMITED
1 series · 14 of 25 positions shown · non-contrast
Comparison: None.

CLINICAL DATA: Chronic epigastric abdominal pain.

EXAM:
ULTRASOUND ABDOMEN LIMITED RIGHT UPPER QUADRANT

[Series 1: us abdomen limited · 14 of 44 slices shown]
[im 1/44]
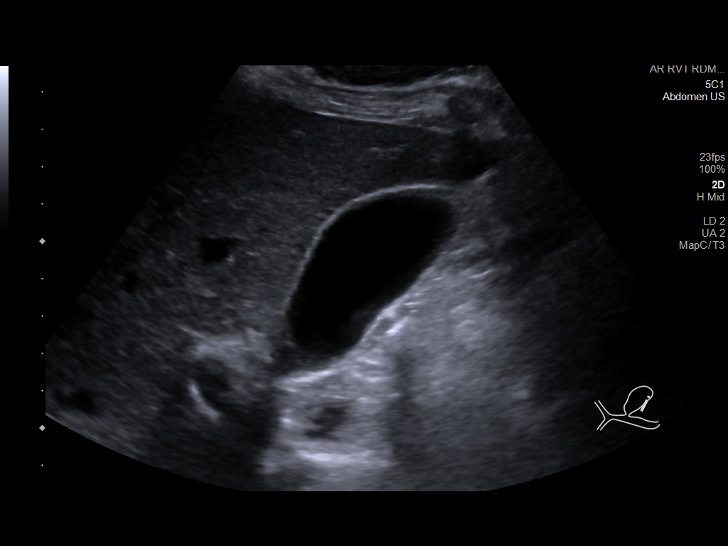
[im 4/44]
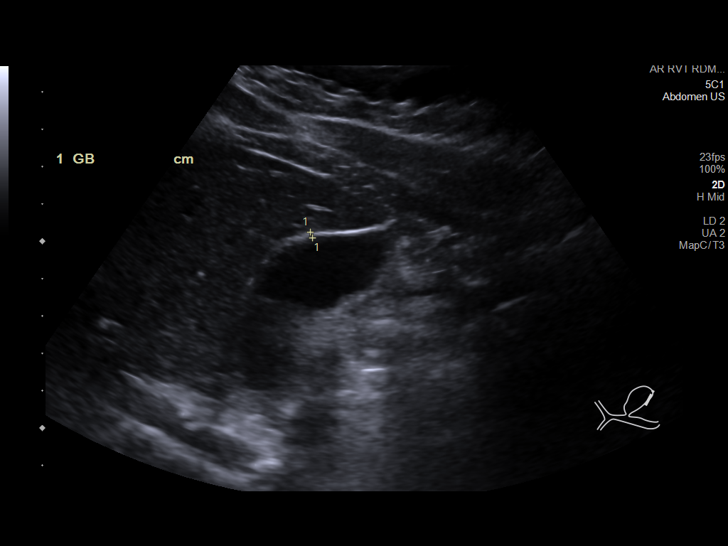
[im 8/44]
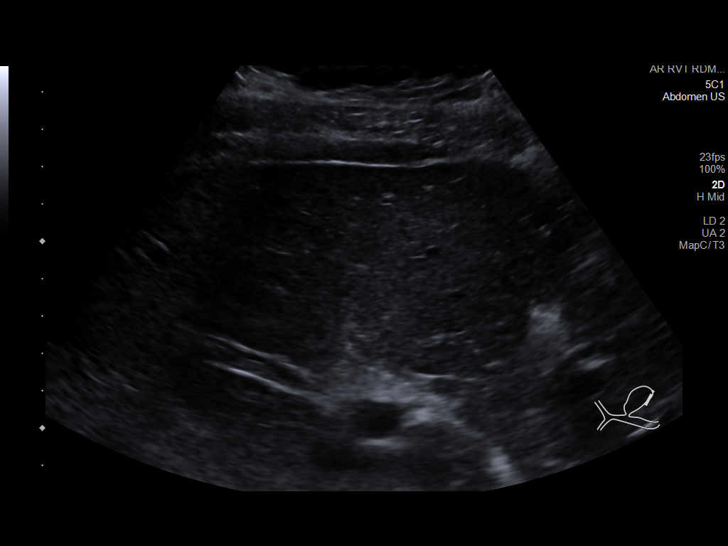
[im 11/44]
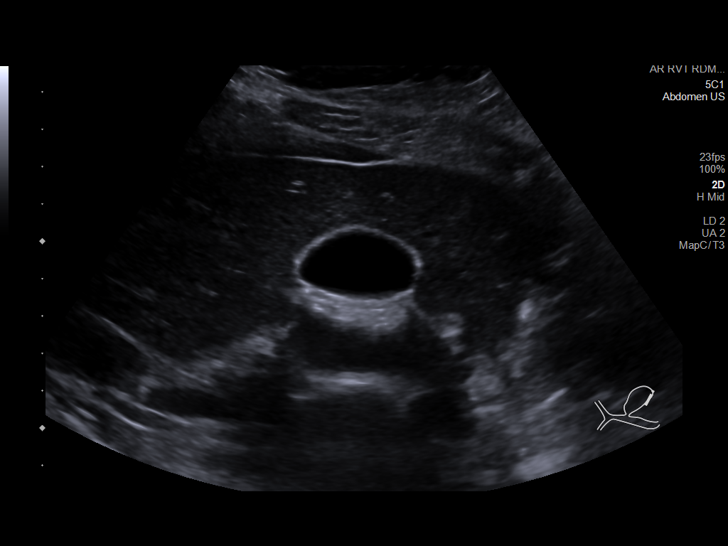
[im 15/44]
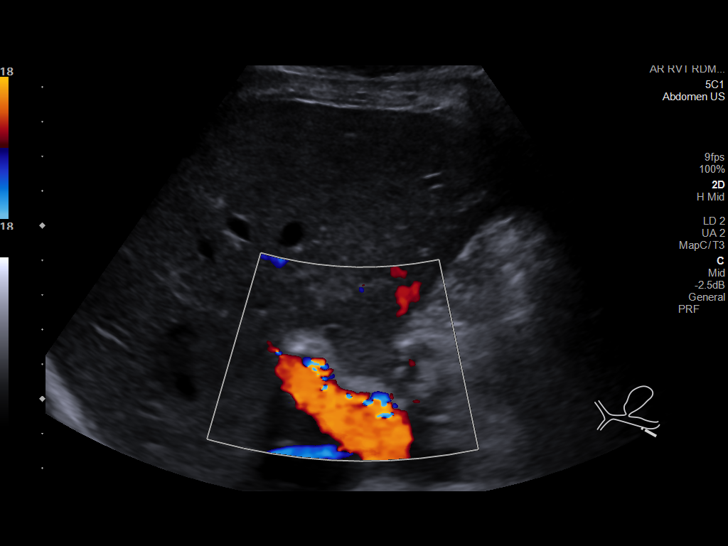
[im 17/44]
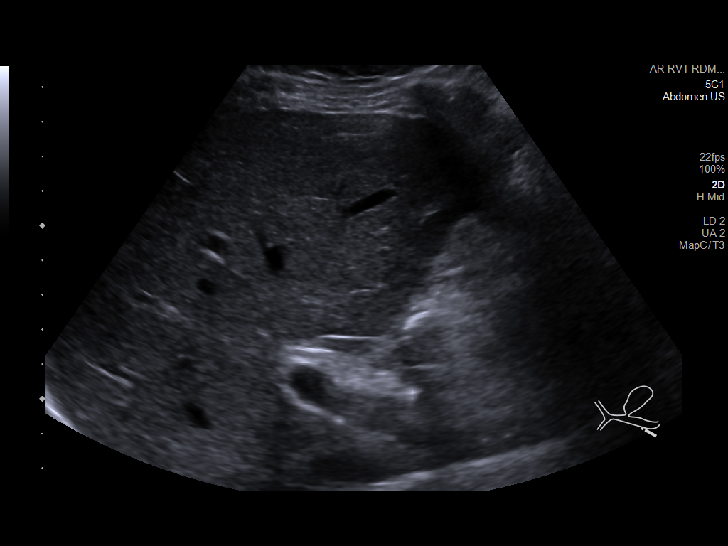
[im 20/44]
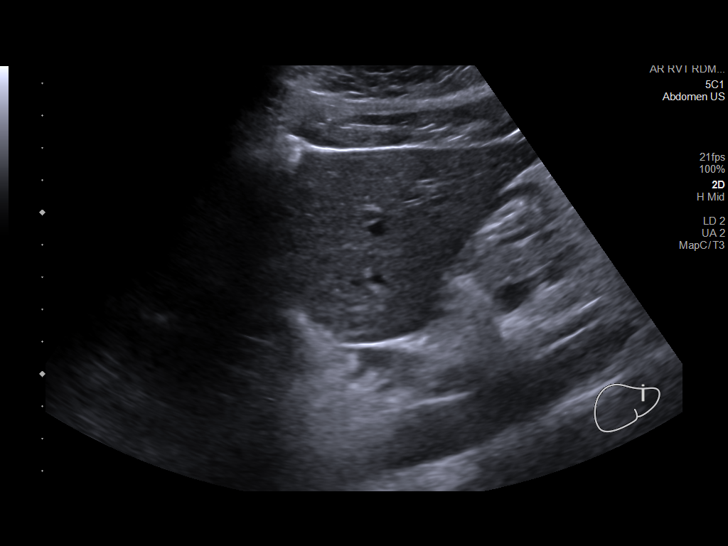
[im 24/44]
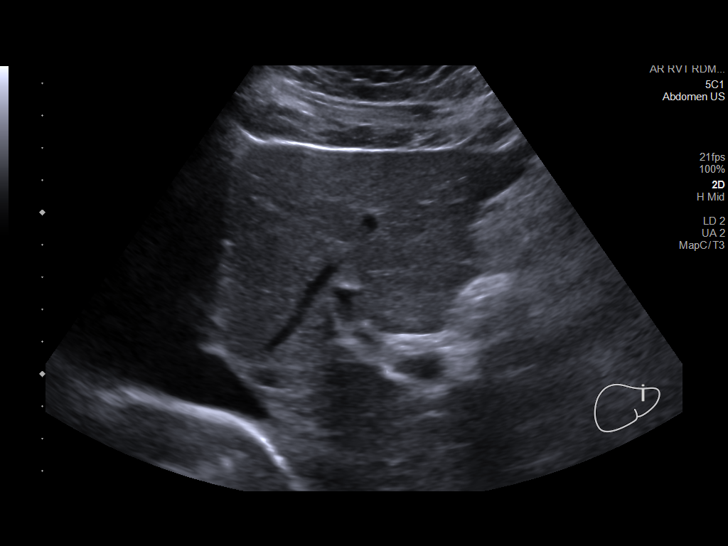
[im 27/44]
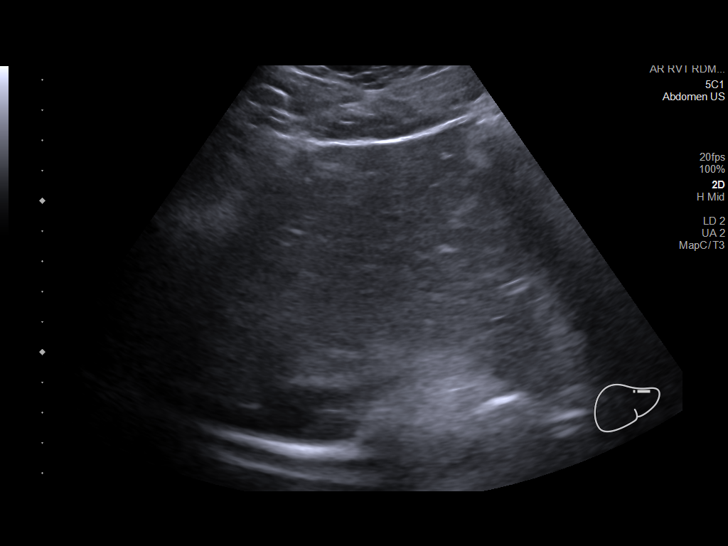
[im 29/44]
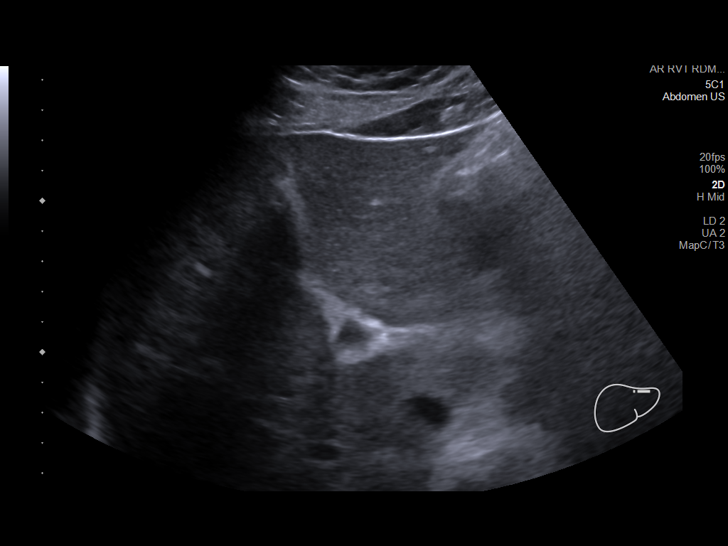
[im 33/44]
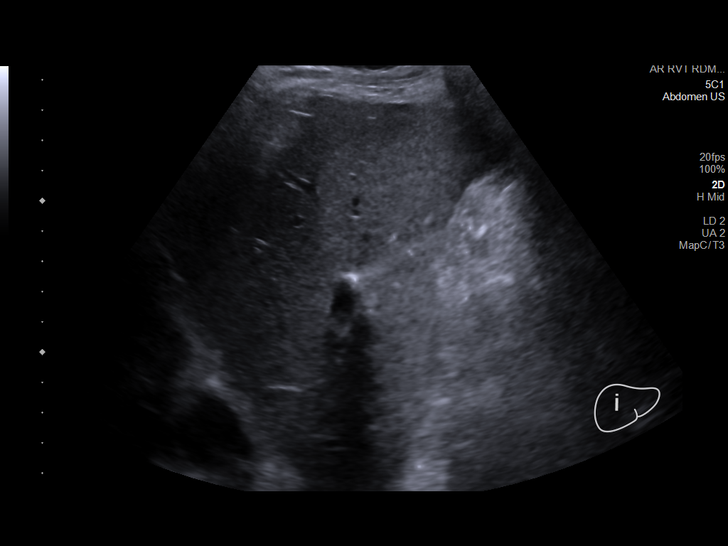
[im 36/44]
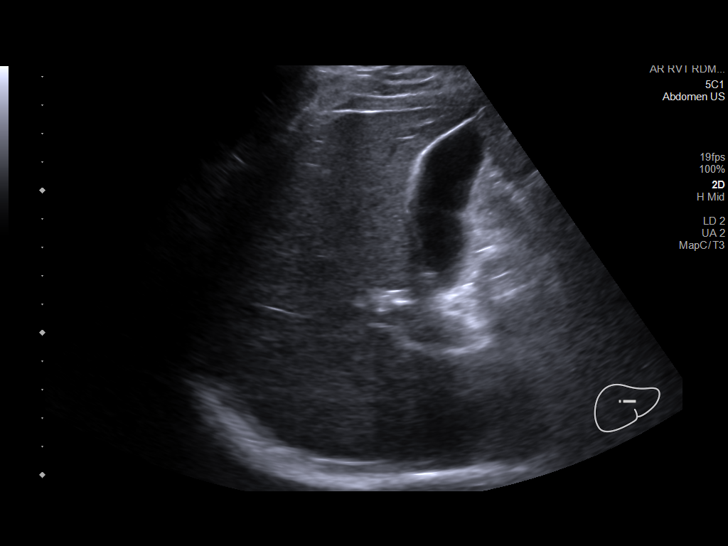
[im 40/44]
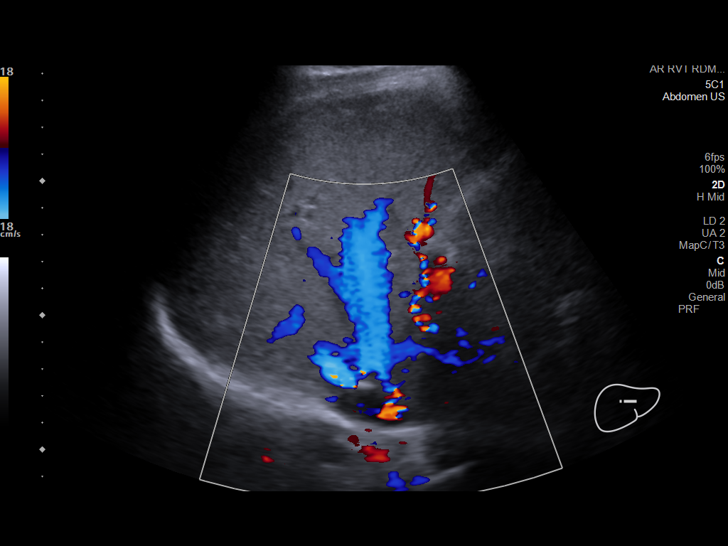
[im 44/44]
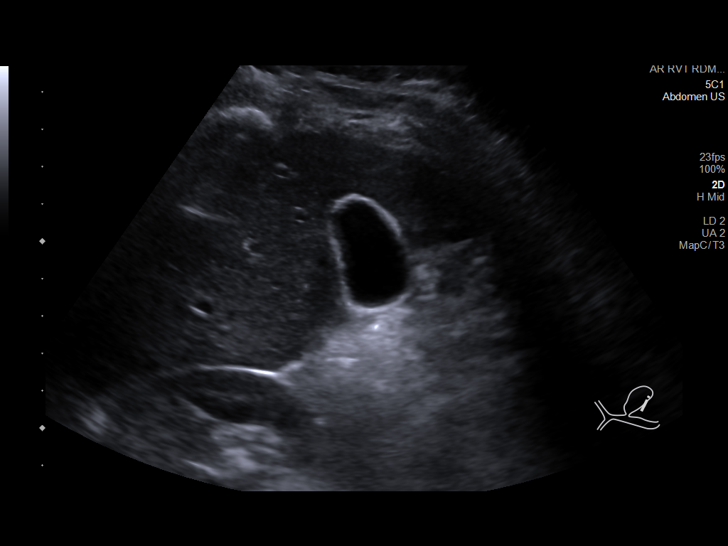

[14 of 25 positions shown; findings below may reference images not displayed]

FINDINGS: Gallbladder:

No gallstones or wall thickening visualized. No sonographic Murphy
sign noted by sonographer.

Common bile duct:

Diameter: 3 mm which is within normal limits.

Liver:

No focal lesion identified. Within normal limits in parenchymal
echogenicity. Portal vein is patent on color Doppler imaging with
normal direction of blood flow towards the liver.

Other: None.
IMPRESSION: No definite abnormality seen in the right upper quadrant of the
abdomen.
